# Patient Record
Sex: Male | Born: 1980 | Race: Black or African American | Hispanic: No | Marital: Single | Smoking: Never smoker
Health system: Southern US, Community
[De-identification: ages and names within clinical notes are randomized; demographics above are authoritative.]

## PROBLEM LIST (undated history)

## (undated) DIAGNOSIS — J45909 Unspecified asthma, uncomplicated: Secondary | ICD-10-CM

---

## 2020-06-21 ENCOUNTER — Emergency Department (HOSPITAL_COMMUNITY)
Admission: EM | Admit: 2020-06-21 | Discharge: 2020-06-21 | Disposition: A | Discharge status: Home / Self Care | Payer: Managed Care, Other (non HMO) | Attending: Emergency Medicine | Admitting: Emergency Medicine

## 2020-06-21 ENCOUNTER — Encounter (HOSPITAL_COMMUNITY): Payer: Self-pay | Admitting: Emergency Medicine

## 2020-06-21 DIAGNOSIS — R000 Tachycardia, unspecified: Secondary | ICD-10-CM | POA: Diagnosis not present

## 2020-06-21 DIAGNOSIS — J45909 Unspecified asthma, uncomplicated: Secondary | ICD-10-CM | POA: Diagnosis not present

## 2020-06-21 DIAGNOSIS — R519 Headache, unspecified: Secondary | ICD-10-CM | POA: Diagnosis present

## 2020-06-21 DIAGNOSIS — U071 COVID-19: Secondary | ICD-10-CM | POA: Diagnosis not present

## 2020-06-21 HISTORY — DX: Unspecified asthma, uncomplicated: J45.909

## 2020-06-21 LAB — CBC WITH DIFFERENTIAL/PLATELET
Abs Immature Granulocytes: 0.01 10*3/uL (ref 0.00–0.07)
Basophils Absolute: 0 10*3/uL (ref 0.0–0.1)
Basophils Relative: 0 %
Eosinophils Absolute: 0.1 10*3/uL (ref 0.0–0.5)
Eosinophils Relative: 1 %
HCT: 41.5 % (ref 39.0–52.0)
Hemoglobin: 13.8 g/dL (ref 13.0–17.0)
Immature Granulocytes: 0 %
Lymphocytes Relative: 9 %
Lymphs Abs: 0.6 10*3/uL — ABNORMAL LOW (ref 0.7–4.0)
MCH: 27.5 pg (ref 26.0–34.0)
MCHC: 33.3 g/dL (ref 30.0–36.0)
MCV: 82.7 fL (ref 80.0–100.0)
Monocytes Absolute: 1 10*3/uL (ref 0.1–1.0)
Monocytes Relative: 14 %
Neutro Abs: 5.2 10*3/uL (ref 1.7–7.7)
Neutrophils Relative %: 76 %
Platelets: 275 10*3/uL (ref 150–400)
RBC: 5.02 MIL/uL (ref 4.22–5.81)
RDW: 14.3 % (ref 11.5–15.5)
WBC: 6.9 10*3/uL (ref 4.0–10.5)
nRBC: 0 % (ref 0.0–0.2)

## 2020-06-21 LAB — COMPREHENSIVE METABOLIC PANEL
ALT: 38 U/L (ref 0–44)
AST: 40 U/L (ref 15–41)
Albumin: 4 g/dL (ref 3.5–5.0)
Alkaline Phosphatase: 111 U/L (ref 38–126)
Anion gap: 11 (ref 5–15)
BUN: 10 mg/dL (ref 6–20)
CO2: 22 mmol/L (ref 22–32)
Calcium: 9.1 mg/dL (ref 8.9–10.3)
Chloride: 103 mmol/L (ref 98–111)
Creatinine, Ser: 1.31 mg/dL — ABNORMAL HIGH (ref 0.61–1.24)
GFR, Estimated: >60 mL/min (ref >60)
Glucose, Bld: 152 mg/dL — ABNORMAL HIGH (ref 70–99)
Potassium: 5.2 mmol/L — ABNORMAL HIGH (ref 3.5–5.1)
Sodium: 136 mmol/L (ref 135–145)
Total Bilirubin: 0.6 mg/dL (ref 0.3–1.2)
Total Protein: 6.8 g/dL (ref 6.5–8.1)

## 2020-06-21 LAB — RESP PANEL BY RT-PCR (FLU A&B, COVID) ARPGX2
Influenza A by PCR: NEGATIVE
Influenza B by PCR: NEGATIVE
SARS Coronavirus 2 by RT PCR: POSITIVE — AB

## 2020-06-21 LAB — LACTIC ACID, PLASMA
Lactic Acid, Venous: 2.2 mmol/L (ref 0.5–1.9)
Lactic Acid, Venous: 1.3 mmol/L (ref 0.5–1.9)

## 2020-06-21 MED ORDER — ACETAMINOPHEN 325 MG PO TABS
650.0000 mg | ORAL_TABLET | Freq: Once | ORAL | Status: AC | PRN
  Administered 2020-06-21: 650 mg via ORAL
  Filled 2020-06-21: qty 2

## 2020-06-21 NOTE — ED Provider Notes (Signed)
[facility] EMERGENCY DEPARTMENT Provider Note   CSN: [id number] Arrival date & time: 06/21/20  8250     History Chief Complaint  Patient presents with  . Headache  . Fever    Jose Valencia is a 39 y.o. male with PMH of asthma presents to the ED with a 1 day history of headache and generalized body aches.  He was noted to be febrile to 101.8 F and tachycardic to 112 in triage.  On my examination, patient reports that yesterday he was feeling perfectly fine and then at approximately 7 PM he started feeling "weird".  He turned to his girlfriend stated that something was not right.  He felt hot, with generalized malaise.  He also endorsed a mild headache.  This morning he woke up to go to work and still felt uncomfortable was prompted him to come to the ED for evaluation.  He is not immunized for influenza or COVID-19.  He lives with his girlfriend and children.  He states that his daughter was sick a couple of days ago.  He denies any chest pain, shortness of breath, cough, difficulty breathing, numbness or weakness, nausea, inability to eat or drink, or other symptoms.  HPI     Past Medical History:  Diagnosis Date  . Asthma     There are no problems to display for this patient.   History reviewed. No pertinent surgical history.     No family history on file.     Home Medications Prior to Admission medications   Not on File    Allergies    Patient has no known allergies.  Review of Systems   Review of Systems  All other systems reviewed and are negative.   Physical Exam Updated Vital Signs BP 125/76 (BP Location: Right Arm)   Pulse (!) 103   Temp 99.5 F (37.5 C) (Oral)   Resp 20   Ht 5\' 7"  (1.702 m)   Wt 84.8 kg   SpO2 96%   BMI 29.29 kg/m   Physical Exam Vitals and nursing note reviewed. Exam conducted with a chaperone present.  HENT:     Head: Normocephalic and atraumatic.  Eyes:     General: No scleral icterus.     Conjunctiva/sclera: Conjunctivae normal.  Cardiovascular:     Rate and Rhythm: Regular rhythm. Tachycardia present.     Pulses: Normal pulses.     Heart sounds: Normal heart sounds.  Pulmonary:     Effort: Pulmonary effort is normal. No respiratory distress.     Breath sounds: Normal breath sounds. No wheezing or rales.  Musculoskeletal:     Cervical back: Normal range of motion. No rigidity.     Right lower leg: No edema.     Left lower leg: No edema.  Skin:    General: Skin is dry.     Capillary Refill: Capillary refill takes less than 2 seconds.  Neurological:     General: No focal deficit present.     Mental Status: He is alert and oriented to person, place, and time.     GCS: GCS eye subscore is 4. GCS verbal subscore is 5. GCS motor subscore is 6.  Psychiatric:        Mood and Affect: Mood normal.        Behavior: Behavior normal.        Thought Content: Thought content normal.     ED Results / Procedures / Treatments   Labs (all labs ordered are  listed, but only abnormal results are displayed) Labs Reviewed  RESP PANEL BY RT-PCR (FLU A&B, COVID) ARPGX2 - Abnormal; Notable for the following components:      Result Value   SARS Coronavirus 2 by RT PCR POSITIVE (*)    All other components within normal limits  LACTIC ACID, PLASMA - Abnormal; Notable for the following components:   Lactic Acid, Venous 2.2 (*)    All other components within normal limits  COMPREHENSIVE METABOLIC PANEL - Abnormal; Notable for the following components:   Potassium 5.2 (*)    Glucose, Bld 152 (*)    Creatinine, Ser 1.31 (*)    All other components within normal limits  CBC WITH DIFFERENTIAL/PLATELET - Abnormal; Notable for the following components:   Lymphs Abs 0.6 (*)    All other components within normal limits  LACTIC ACID, PLASMA  URINALYSIS, ROUTINE W REFLEX MICROSCOPIC    EKG None  Radiology No results found.  Procedures Procedures (including critical care  time)  Medications Ordered in ED Medications  acetaminophen (TYLENOL) tablet 650 mg (650 mg Oral Given 06/21/20 0750)    ED Course  I have reviewed the triage vital signs and the nursing notes.  Pertinent labs & imaging results that were available during my care of the patient were reviewed by me and considered in my medical decision making (see chart for details).    MDM Rules/Calculators/A&P                          Patient is resting comfortably on my examination and in no acute distress.  His only symptoms are mild headache and generalized malaise.  His fever was treated appropriately here in the ED with Tylenol and his temperature is now within normal months.  He is mildly tachycardic, likely due to mild dehydration.  I suspect that is also contributing to his mildly elevated lactic acid and mild creatinine elevation.    Respiratory panel by PCR is positive for COVID-19.  He is unvaccinated.  We will reach out to the [facility] infusion center and provide him with a phone number to contact them.  Given his history of asthma and mildly elevated BMI, he is at risk for poor prognosis, particularly given that he is unvaccinated.  Emphasized the importance of [facility] outpatient follow-up.  He has albuterol inhalers at home.  No wheezing or other abnormal breath sounds on my exam.  Patient will use over-the-counter medications for symptomatic relief of his cold symptoms.  Emphasized the importance of increased oral hydration in the context of fevers.  He will check his temperature regularly take Tylenol/ibuprofen as needed for fever control.  He will maintain isolation precautions.  New guidelines suggest 5 days from symptom onset.    ED return precautions discussed.  Patient voices understanding and is agreeable to the plan.  Jose Valencia was evaluated in Emergency Department on 06/21/2020 for the symptoms described in the history of present illness. He was evaluated in the context of the global  COVID-19 pandemic, which necessitated consideration that the patient might be at risk for infection with the SARS-CoV-2 virus that causes COVID-19. Institutional protocols and algorithms that pertain to the evaluation of patients at risk for COVID-19 are in a state of rapid change based on information released by regulatory bodies including the CDC and federal and state organizations. These policies and algorithms were followed during the patient's care in the ED.   Final Clinical Impression(s) / ED Diagnoses  Final diagnoses:  COVID-19    Rx / DC Orders ED Discharge Orders    None       Lorelee New, PA-C 06/21/20 1025    Cathren Laine, MD 06/21/20 1032

## 2020-06-21 NOTE — Discharge Instructions (Signed)
You have tested positive for COVID-19.  Please maintain isolation precautions.  I have contacted the [facility] infusion center and they should give you a phone call at some point.  You may also call them to schedule appointment at [phone].  Please check your temperature regularly and take Tylenol as needed for fever control.  You are likely dehydrated, I recommend increased oral hydration.  You may also use a pulse oximeter that can be obtained over-the-counter to pharmacy to check your oxygen levels if you begin to feel short of breath.  Return to the ED or seek immediate medical attention should you experience any new or worsening symptoms.

## 2020-06-21 NOTE — ED Triage Notes (Signed)
Pt reports headache and body aches that began yesterday. Denies fevers, cough or diarrhea. No recent sick contacts. Febrile in triage.

## 2021-11-29 ENCOUNTER — Emergency Department (HOSPITAL_COMMUNITY)
Admission: EM | Admit: 2021-11-29 | Discharge: 2021-11-30 | Disposition: A | Discharge status: Home / Self Care | Payer: Self-pay | Attending: Emergency Medicine | Admitting: Emergency Medicine

## 2021-11-29 ENCOUNTER — Emergency Department (HOSPITAL_COMMUNITY): Payer: Self-pay

## 2021-11-29 ENCOUNTER — Other Ambulatory Visit: Payer: Self-pay

## 2021-11-29 ENCOUNTER — Encounter (HOSPITAL_COMMUNITY): Payer: Self-pay | Admitting: Emergency Medicine

## 2021-11-29 DIAGNOSIS — R070 Pain in throat: Secondary | ICD-10-CM | POA: Insufficient documentation

## 2021-11-29 DIAGNOSIS — T7840XA Allergy, unspecified, initial encounter: Secondary | ICD-10-CM | POA: Insufficient documentation

## 2021-11-29 LAB — COMPREHENSIVE METABOLIC PANEL
ALT: 34 U/L (ref 0–44)
AST: 23 U/L (ref 15–41)
Albumin: 4.1 g/dL (ref 3.5–5.0)
Alkaline Phosphatase: 96 U/L (ref 38–126)
Anion gap: 10 (ref 5–15)
BUN: 13 mg/dL (ref 6–20)
CO2: 26 mmol/L (ref 22–32)
Calcium: 9.7 mg/dL (ref 8.9–10.3)
Chloride: 102 mmol/L (ref 98–111)
Creatinine, Ser: 1.08 mg/dL (ref 0.61–1.24)
GFR, Estimated: >60 mL/min (ref >60)
Glucose, Bld: 88 mg/dL (ref 70–99)
Potassium: 3.7 mmol/L (ref 3.5–5.1)
Sodium: 138 mmol/L (ref 135–145)
Total Bilirubin: 0.2 mg/dL — ABNORMAL LOW (ref 0.3–1.2)
Total Protein: 7 g/dL (ref 6.5–8.1)

## 2021-11-29 LAB — CBC WITH DIFFERENTIAL/PLATELET
Abs Immature Granulocytes: 0.06 10*3/uL (ref 0.00–0.07)
Basophils Absolute: 0 10*3/uL (ref 0.0–0.1)
Basophils Relative: 0 %
Eosinophils Absolute: 0.1 10*3/uL (ref 0.0–0.5)
Eosinophils Relative: 0 %
HCT: 47.6 % (ref 39.0–52.0)
Hemoglobin: 15.8 g/dL (ref 13.0–17.0)
Immature Granulocytes: 0 %
Lymphocytes Relative: 9 %
Lymphs Abs: 1.6 10*3/uL (ref 0.7–4.0)
MCH: 27.4 pg (ref 26.0–34.0)
MCHC: 33.2 g/dL (ref 30.0–36.0)
MCV: 82.5 fL (ref 80.0–100.0)
Monocytes Absolute: 1 10*3/uL (ref 0.1–1.0)
Monocytes Relative: 6 %
Neutro Abs: 13.9 10*3/uL — ABNORMAL HIGH (ref 1.7–7.7)
Neutrophils Relative %: 85 %
Platelets: 313 10*3/uL (ref 150–400)
RBC: 5.77 MIL/uL (ref 4.22–5.81)
RDW: 13.9 % (ref 11.5–15.5)
WBC: 16.6 10*3/uL — ABNORMAL HIGH (ref 4.0–10.5)
nRBC: 0 % (ref 0.0–0.2)

## 2021-11-29 LAB — LACTIC ACID, PLASMA
Lactic Acid, Venous: 1.3 mmol/L (ref 0.5–1.9)
Lactic Acid, Venous: 1 mmol/L (ref 0.5–1.9)

## 2021-11-29 LAB — TROPONIN I (HIGH SENSITIVITY)
Troponin I (High Sensitivity): 9 ng/L (ref <18)
Troponin I (High Sensitivity): 11 ng/L (ref <18)

## 2021-11-29 MED ORDER — FAMOTIDINE 20 MG PO TABS: 20.0000 mg | ORAL_TABLET | Freq: Once | ORAL | Status: DC

## 2021-11-29 MED ORDER — EPINEPHRINE 0.3 MG/0.3ML IJ SOAJ
0.3000 mg | Freq: Once | INTRAMUSCULAR | Status: AC
  Administered 2021-11-29: 0.3 mg via INTRAMUSCULAR
  Filled 2021-11-29: qty 0.3

## 2021-11-29 MED ORDER — PREDNISONE 20 MG PO TABS: ORAL_TABLET | ORAL | 0 refills | Status: DC

## 2021-11-29 MED ORDER — DIPHENHYDRAMINE HCL 25 MG PO TABS: ORAL_TABLET | ORAL | 0 refills | Status: DC

## 2021-11-29 MED ORDER — FAMOTIDINE IN NACL 20-0.9 MG/50ML-% IV SOLN
20.0000 mg | Freq: Once | INTRAVENOUS | Status: AC
  Administered 2021-11-29: 20 mg via INTRAVENOUS
  Filled 2021-11-29: qty 50

## 2021-11-29 MED ORDER — DIPHENHYDRAMINE HCL 50 MG/ML IJ SOLN
25.0000 mg | Freq: Once | INTRAMUSCULAR | Status: AC
  Administered 2021-11-29: 25 mg via INTRAVENOUS
  Filled 2021-11-29: qty 1

## 2021-11-29 MED ORDER — DIPHENHYDRAMINE HCL 25 MG PO CAPS: 25.0000 mg | ORAL_CAPSULE | Freq: Once | ORAL | Status: DC

## 2021-11-29 MED ORDER — SODIUM CHLORIDE 0.9 % IV SOLN: Freq: Once | INTRAVENOUS | Status: DC

## 2021-11-29 MED ORDER — METHYLPREDNISOLONE SODIUM SUCC 125 MG IJ SOLR
125.0000 mg | Freq: Once | INTRAMUSCULAR | Status: AC
  Administered 2021-11-29: 125 mg via INTRAVENOUS
  Filled 2021-11-29: qty 2

## 2021-11-29 MED ORDER — SODIUM CHLORIDE 0.9 % IV BOLUS
1000.0000 mL | Freq: Once | INTRAVENOUS | Status: AC
  Administered 2021-11-29: 1000 mL via INTRAVENOUS

## 2021-11-29 MED ORDER — IOHEXOL 350 MG/ML SOLN
75.0000 mL | Freq: Once | INTRAVENOUS | Status: AC | PRN
Start: 2021-11-29 — End: 2021-11-29
  Administered 2021-11-29: 75 mL via INTRAVENOUS

## 2021-11-29 MED ORDER — PREDNISONE 20 MG PO TABS: 60.0000 mg | ORAL_TABLET | Freq: Once | ORAL | Status: DC

## 2021-11-29 NOTE — Discharge Instructions (Addendum)
1.  At this time it appears you had an allergic reaction.  It was unusual in the amount of pain you experienced in your neck and throat.  CT scan does not show any abnormalities at this time.  Your treatment will continue to be for allergic reaction.  Take prednisone daily as prescribed.  Take Benadryl 1 to 2 tablets every 6 hours.  Go to the pharmacy and buy over-the-counter Pepcid and take twice daily for the next week. 2.  Get established with a family doctor for recheck.  Use the referral number in your discharge instructions to find one. 3.  Return to emergency department immediately if you find you are having difficulty breathing swallowing feeling lightheaded getting fevers worsening neck pain or any other concerning symptoms.

## 2021-11-29 NOTE — ED Provider Triage Note (Addendum)
Emergency Medicine Provider Triage Evaluation Note  Jose Valencia , a 41 y.o. male  was evaluated in triage.  Pt complains of allergic reaction. Started one hour PTA 2:40 PM.  He feels like his throat is closing up.  No history of allergies, no known exposures.  This happened when his wife was vacuuming, they have a new cat they got 1 week ago.  No hives, no nausea, no vomiting, no chest pain, no shortness of breath.  Review of Systems  Per HPI  Physical Exam  BP 104/79   Pulse (!) 108   Temp 98.1 F (36.7 C) (Oral)   Resp 16   SpO2 95%  Gen:   Awake, no distress   Resp:  Normal effort  MSK:   Moves extremities without difficulty  Other:  No stridor, no hives.  Uvula is midline  Medical Decision Making  Medically screening exam initiated at 3:58 PM.  Appropriate orders placed.  Jose Valencia was informed that the remainder of the evaluation will be completed by another provider, this initial triage assessment does not replace that evaluation, and the importance of remaining in the ED until their evaluation is complete.  Patient stable, will try oral meds and observe.  At this time is not in anaphylaxis.  At this time of reevaluation patient's throat tingling is worsening.  Discussed with Dr. Hyacinth Meeker, plan to give epi and observe.   Theron Arista, PA-C 11/29/21 1559    Theron Arista, PA-C 11/29/21 1651

## 2021-11-29 NOTE — ED Notes (Signed)
PT to CT.

## 2021-11-29 NOTE — ED Triage Notes (Signed)
Pt reports throat swelling at 1440 while his wife was vacuuming. Unknown exposure. Pt reports new cat in the house.

## 2021-11-29 NOTE — ED Provider Notes (Signed)
[facility] EMERGENCY DEPARTMENT Provider Note   CSN: [id number] Arrival date & time: 11/29/21  1545     History  Chief Complaint  Patient presents with   Allergic Reaction    Jose Valencia is a 41 y.o. male.  HPI Patient was taking nap at home.  About 2:40 PM his wife was vacuuming.  He reports he thinks he had an allergic reaction to cat hair.  He reports he got a new cat about a week ago.  He pretty quickly noticed swelling and discomfort in his lips and his throat.  He reports he could feel his throat closing he reports he had visible swelling in the neck and around the lips..  Patient did try 2 Benadryl tablets at home and about that time.  He reports symptoms were still present and he presented to the emergency department.  He was being observed in triage and complained of increasing pain and tightness in the throat.  Patient was given subcu epi in triage and feels like symptoms have started to improve.  He still feels somewhat tight and uncomfortable in the throat.  He did not develop any full body rash..  At baseline patient reports he is healthy.  No regular medications.    Home Medications Prior to Admission medications   Not on File      Allergies    Patient has no known allergies.    Review of Systems   Review of Systems 10 systems reviewed negative except as per HPI Physical Exam Updated Vital Signs BP (!) 128/98   Pulse 99   Temp 98.1 F (36.7 C) (Oral)   Resp (!) 23   SpO2 98%  Physical Exam Constitutional:      Comments: Alert nontoxic well-nourished well-developed no respiratory distress  HENT:     Head: Normocephalic and atraumatic.     Mouth/Throat:     Comments: No tongue swelling.  Patient does have moderate diffuse swelling of the lips.  Voice is slightly hoarse.  No stridor. Eyes:     Extraocular Movements: Extraocular movements intact.  Neck:     Comments: Mild submandibular fullness.  No trismus or meningismus Cardiovascular:      Rate and Rhythm: Normal rate and regular rhythm.  Pulmonary:     Effort: Pulmonary effort is normal.     Breath sounds: Normal breath sounds.  Abdominal:     General: There is no distension.     Palpations: Abdomen is soft.     Tenderness: There is no abdominal tenderness. There is no guarding.  Musculoskeletal:        General: No swelling or tenderness. Normal range of motion.     Right lower leg: No edema.     Left lower leg: No edema.  Skin:    General: Skin is warm and dry.  Neurological:     General: No focal deficit present.     Mental Status: He is oriented to person, place, and time.     Motor: No weakness.     Coordination: Coordination normal.     ED Results / Procedures / Treatments   Labs (all labs ordered are listed, but only abnormal results are displayed) Labs Reviewed - No data to display  EKG None  Radiology No results found.  Procedures Procedures   CRITICAL CARE Performed by: Arby Barrette   Total critical care time: 30 minutes  Critical care time was exclusive of separately billable procedures and treating other patients.  Critical  care was necessary to treat or prevent imminent or life-threatening deterioration.  Critical care was time spent personally by me on the following activities: development of treatment plan with patient and/or surrogate as well as nursing, discussions with consultants, evaluation of patient's response to treatment, examination of patient, obtaining history from patient or surrogate, ordering and performing treatments and interventions, ordering and review of laboratory studies, ordering and review of radiographic studies, pulse oximetry and re-evaluation of patient's condition.  Medications Ordered in ED Medications  methylPREDNISolone sodium succinate (SOLU-MEDROL) 125 mg/2 mL injection 125 mg (has no administration in time range)  diphenhydrAMINE (BENADRYL) injection 25 mg (has no administration in time range)   famotidine (PEPCID) IVPB 20 mg premix (has no administration in time range)  0.9 %  sodium chloride infusion (has no administration in time range)  EPINEPHrine (EPI-PEN) injection 0.3 mg (0.3 mg Intramuscular Given 11/29/21 1656)    ED Course/ Medical Decision Making/ A&P                           Medical Decision Making Amount and/or Complexity of Data Reviewed Labs: ordered. Radiology: ordered.  Risk OTC drugs. Prescription drug management.  Patient had an acute onset of swelling around the mouth and the airway.  He associates this with exposure to cat dander.  It happened while his wife was vacuuming in the house and came on fairly abruptly.  Did improve with subcu epi in triage.  Patient does not have a history of prior allergies.  No significant medical history.  Patient was not ill leading up to this.  This time high suspicion is for allergic reaction.  Patient does not have any precipitants for angioedema however will consider angioedema versus allergic reaction.  At this time we will proceed with Solu-Medrol Pepcid and Benadryl and continue to observe.  18: 13 consult: Dr. Suszanne Conners.  Reviewed the patient's history with persisting and worsening dysphagia with gravelly voice and anterior neck pain.  Recommends addition of CT neck.  CT scan images are examined and reviewed by myself as well as I reviewed radiology interpretation.  Patient's airway is widely patent.  There is slight soft tissue edema by radiology interpretation.  Otherwise normal.  Patient is reassessed after treatment.  He does feel improved.  He still has some discomfort around his throat and neck.  He has had no difficulty breathing.  He did not have any other associated symptoms of rash or difficulty breathing to suggest anaphylaxis.  There is a question of angioedema however no apparent trigger and patient does describe acute onset with exposure to dust and dander.  At this time we will proceed with treatment for allergic  reaction.  Patient will continue prednisone and Benadryl and Pepcid at home.  Careful return precautions reviewed.        Final Clinical Impression(s) / ED Diagnoses Final diagnoses:  Allergic reaction, initial encounter  Throat pain    Rx / DC Orders ED Discharge Orders     None         Arby Barrette, MD 12/02/21 1321

## 2022-01-17 ENCOUNTER — Emergency Department (HOSPITAL_COMMUNITY)
Admission: EM | Admit: 2022-01-17 | Discharge: 2022-01-18 | Disposition: A | Discharge status: Home / Self Care | Payer: Self-pay | Attending: Emergency Medicine | Admitting: Emergency Medicine

## 2022-01-17 ENCOUNTER — Other Ambulatory Visit: Payer: Self-pay

## 2022-01-17 ENCOUNTER — Encounter (HOSPITAL_COMMUNITY): Payer: Self-pay | Admitting: Emergency Medicine

## 2022-01-17 ENCOUNTER — Emergency Department (HOSPITAL_COMMUNITY): Payer: Self-pay

## 2022-01-17 DIAGNOSIS — R531 Weakness: Secondary | ICD-10-CM | POA: Insufficient documentation

## 2022-01-17 DIAGNOSIS — W010XXA Fall on same level from slipping, tripping and stumbling without subsequent striking against object, initial encounter: Secondary | ICD-10-CM | POA: Insufficient documentation

## 2022-01-17 DIAGNOSIS — Y990 Civilian activity done for income or pay: Secondary | ICD-10-CM | POA: Insufficient documentation

## 2022-01-17 DIAGNOSIS — M5441 Lumbago with sciatica, right side: Secondary | ICD-10-CM | POA: Insufficient documentation

## 2022-01-17 DIAGNOSIS — M5410 Radiculopathy, site unspecified: Secondary | ICD-10-CM

## 2022-01-17 MED ORDER — LIDOCAINE 5 % EX PTCH: 1.0000 | MEDICATED_PATCH | Freq: Two times a day (BID) | CUTANEOUS | 0 refills | Status: AC

## 2022-01-17 MED ORDER — METHOCARBAMOL 500 MG PO TABS
1500.0000 mg | ORAL_TABLET | Freq: Once | ORAL | Status: AC
  Administered 2022-01-17: 1500 mg via ORAL
  Filled 2022-01-17: qty 3

## 2022-01-17 MED ORDER — OXYCODONE HCL 5 MG PO TABS
5.0000 mg | ORAL_TABLET | Freq: Once | ORAL | Status: AC
  Administered 2022-01-17: 5 mg via ORAL
  Filled 2022-01-17: qty 1

## 2022-01-17 MED ORDER — METHOCARBAMOL 750 MG PO TABS: 1500.0000 mg | ORAL_TABLET | Freq: Four times a day (QID) | ORAL | 0 refills | Status: AC

## 2022-01-17 MED ORDER — METHYLPREDNISOLONE 4 MG PO TBPK: ORAL_TABLET | ORAL | 0 refills | Status: DC

## 2022-01-17 MED ORDER — DEXAMETHASONE 4 MG PO TABS
4.0000 mg | ORAL_TABLET | Freq: Once | ORAL | Status: AC
  Administered 2022-01-17: 4 mg via ORAL
  Filled 2022-01-17: qty 1

## 2022-01-17 MED ORDER — KETOROLAC TROMETHAMINE 30 MG/ML IJ SOLN
60.0000 mg | Freq: Once | INTRAMUSCULAR | Status: AC
  Administered 2022-01-17: 60 mg via INTRAMUSCULAR
  Filled 2022-01-17 (×2): qty 2

## 2022-01-17 NOTE — Discharge Instructions (Addendum)
You were seen in the emergency department for worsening of your back pain after you slipped on a box.  We got an MRI of your lower back which showed some intervertebral disc bulging affecting the nerves in your lower back which is likely causing your pain.  There was no acute spinal cord injury or impingement seen on the MRI.  Please take the Medrol Dosepak as prescribed, Robaxin 1500 mg 4 times a day, and the pain medication as prescribed.  You can also use the lidocaine patches as needed every 12 hours.  Please follow-up with your primary care physician as well as neurosurgery for further evaluation and management.  If you experience any new or worsening symptoms please return to the emergency department.

## 2022-01-17 NOTE — ED Provider Triage Note (Signed)
Emergency Medicine Provider Triage Evaluation Note  Jose Valencia , a 41 y.o. male  was evaluated in triage.  Pt complains of back pain. States that same was problematic a few years ago and was relieved with a steroid injection. States that he fell on Sunday while at work and reinjured his back. States that he now has to walk with a walker because of pain and weakness in his left leg. Denies any loss of bowel or bladder function or saddle paresthesias.  Denies any numbness or tingling.  Does state that he has shooting pain down his left leg if he moves a certain way.  Denies any fevers or chills.  Review of Systems  Positive:  Negative:   Physical Exam  BP (!) 147/110   Pulse (!) 110   Temp 98.2 F (36.8 C) (Oral)   Resp (!) 22   SpO2 96%  Gen:   Awake, no distress   Resp:  Normal effort  MSK:   Moves extremities without difficulty Other:    Medical Decision Making  Medically screening exam initiated at 4:46 PM.  Appropriate orders placed.  Aswad Wandrey was informed that the remainder of the evaluation will be completed by another provider, this initial triage assessment does not replace that evaluation, and the importance of remaining in the ED until their evaluation is complete.  Work-up initiated   Vear Clock 01/17/22 1651

## 2022-01-17 NOTE — ED Triage Notes (Signed)
Patient here with complaint of right lower back pain radiating into right leg, reports issue with pinched nerve that was treated in [place] but was recently reinjured when he slipped at work recently. Patient denies any urinary or stool incontinence.

## 2022-01-17 NOTE — ED Provider Notes (Signed)
[facility] EMERGENCY DEPARTMENT Provider Note   CSN: [id number] Arrival date & time: 01/17/22  1538     History {Add pertinent medical, surgical, social history, OB history to HPI:1} Chief Complaint  Patient presents with   Back Pain    Jose Valencia is a 41 y.o. male.  Jose Valencia a 41 year old male presenting with 2 days of acute right lower back/hip/leg pain, numbness, and weakness after slipping on a box at work.  He states he was working whenever his foot slipped out from under him and he was able to catch himself with his foot but felt his back immediately started to hurt.  He has had a previous history of right hip and back pain that was relieved in the end by spinal steroid injection.  He states this pain is very similar to that pain.  He has had to use a walker to ambulate since this injury due to increased pain when putting weight on his right leg.  He also states that he gets worse whenever he has to bend over to sit.  Laying flat and still helps alleviate the pain.  He states he has tried muscle relaxers, steroids, opiates, and lidocaine patches in the past that did not fully relieve the pain.   Back Pain Associated symptoms: numbness and weakness   Associated symptoms: no abdominal pain, no chest pain, no dysuria and no fever        Home Medications Prior to Admission medications   Medication Sig Start Date End Date Taking? Authorizing Provider  lidocaine (LIDODERM) 5 % Place 1 patch onto the skin every 12 (twelve) hours for 5 days. Remove & Discard patch within 12 hours or as directed by MD 01/17/22 01/22/22 Yes Rocky Morel, DO  methocarbamol (ROBAXIN-750) 750 MG tablet Take 2 tablets (1,500 mg total) by mouth 4 (four) times daily for 5 days. 01/17/22 01/22/22 Yes Rocky Morel, DO  methylPREDNISolone (MEDROL DOSEPAK) 4 MG TBPK tablet Take as instructed 01/17/22  Yes Rocky Morel, DO  albuterol (VENTOLIN HFA) 108 (90 Base) MCG/ACT inhaler Inhale 2  puffs into the lungs every 6 (six) hours as needed for wheezing or shortness of breath.    [provider]  BAYER BACK & BODY PAIN EX ST 500-32.5 MG TABS Take 2 tablets by mouth every 6 (six) hours as needed (for pain).    [provider]  diphenhydrAMINE (BENADRYL) 25 MG tablet Take 50 mg by mouth as needed (for allergic reactions).    [provider]  diphenhydrAMINE (BENADRYL) 25 MG tablet To 2 tablets every 6 hours for swelling and itching 11/29/21   Arby Barrette, MD  predniSONE (DELTASONE) 20 MG tablet 2 tabs po daily x 4 days 11/29/21   Arby Barrette, MD      Allergies    Shellfish-derived products and Other    Review of Systems   Review of Systems  Constitutional:  Negative for fever.  Respiratory:  Negative for cough and shortness of breath.   Cardiovascular:  Negative for chest pain and leg swelling.  Gastrointestinal:  Negative for abdominal pain, constipation, diarrhea, nausea and vomiting.       No bowel or bladder incontinence  Genitourinary:  Negative for difficulty urinating, dysuria and hematuria.  Musculoskeletal:  Positive for back pain and myalgias. Negative for neck pain and neck stiffness.  Neurological:  Positive for weakness and numbness. Negative for dizziness, syncope, facial asymmetry and light-headedness.  Psychiatric/Behavioral:  Negative for confusion.     Physical  Exam Updated Vital Signs BP 135/87 (BP Location: Right Arm)   Pulse 80   Temp 98.5 F (36.9 C) (Oral)   Resp 17   SpO2 96%  Physical Exam Vitals reviewed.  Constitutional:      Appearance: He is not ill-appearing.     Comments: Middle-age male laying in bed who appears uncomfortable.  HENT:     Mouth/Throat:     Mouth: Mucous membranes are moist.     Pharynx: Oropharynx is clear. No oropharyngeal exudate or posterior oropharyngeal erythema.  Eyes:     General: No scleral icterus.    Extraocular Movements: Extraocular movements intact.      Conjunctiva/sclera: Conjunctivae normal.     Pupils: Pupils are equal, round, and reactive to light.  Musculoskeletal:     Lumbar back: Tenderness (Right paraspinal tenderness, no left paraspinal tenderness) present. No swelling, deformity or bony tenderness.  Neurological:     Mental Status: He is alert and oriented to person, place, and time.     Cranial Nerves: Cranial nerves 2-12 are intact.     Sensory: Sensory deficit (Decree sensation over right leg diffusely involving dermatomes of L2-S1) present.     Motor: Weakness (Decrease strength in right lower extremity which may be due to pain but true weakness cannot be ruled out.) present.     ED Results / Procedures / Treatments   Labs (all labs ordered are listed, but only abnormal results are displayed) Labs Reviewed - No data to display  EKG None  Radiology No results found.  Procedures Procedures  {Document cardiac monitor, telemetry assessment procedure when appropriate:1}  Medications Ordered in ED Medications  ketorolac (TORADOL) 30 MG/ML injection 60 mg (60 mg Intramuscular Given 01/17/22 2011)  methocarbamol (ROBAXIN) tablet 1,500 mg (1,500 mg Oral Given 01/17/22 2010)  dexamethasone (DECADRON) tablet 4 mg (4 mg Oral Given 01/17/22 2011)  oxyCODONE (Oxy IR/ROXICODONE) immediate release tablet 5 mg (5 mg Oral Given 01/17/22 2318)    ED Course/ Medical Decision Making/ A&P                           Medical Decision Making Jose Valencia is a 41 year old male presenting with acute right back/hip/leg pain, weakness, numbness after a near fall that is consistent with a prior back and hip pain that ultimately required spinal steroid injection.  History and physical are concerning for reaggravation of possible nerve impingement but there were no signs or symptoms of cauda equina, conus medullaris, or acute bony abnormalities.  He had an MRI in December 2022 which showed no obvious abnormality that be causing his pain.  Due to his  decreased sensation and possible weakness of the right lower extremity we will obtain a lumbar MRI without contrast. MRI of the lumbar spine without contrast showed bilateral foraminal disc protrusions at the L4-5 levels affecting the left L3 and L4 nerve roots and the right L4 nerve root although the study was limited by moderate to severe motion degradation.  After discussing with the patient we will discharge him with recommended close PCP and neuro follow-up.  We prescribed Robaxin, Medrol Dosepak, pain medication, and lidocaine patches in the interim.  Patient was discharged in stable condition with all questions answered prior to discharge.  Problems Addressed: Acute right-sided low back pain with right-sided sciatica: acute illness or injury Radicular pain: acute illness or injury  Amount and/or Complexity of Data Reviewed External Data Reviewed: notes. Radiology: ordered and independent interpretation  performed. Decision-making details documented in ED Course.  Risk Prescription drug management.   ***  {Document critical care time when appropriate:1} {Document review of labs and clinical decision tools ie heart score, Chads2Vasc2 etc:1}  {Document your independent review of radiology images, and any outside records:1} {Document your discussion with family members, caretakers, and with consultants:1} {Document social determinants of health affecting pt's care:1} {Document your decision making why or why not admission, treatments were needed:1} Final Clinical Impression(s) / ED Diagnoses Final diagnoses:  Radicular pain  Acute right-sided low back pain with right-sided sciatica    Rx / DC Orders ED Discharge Orders          Ordered    methocarbamol (ROBAXIN-750) 750 MG tablet  4 times daily        01/17/22 2331    methylPREDNISolone (MEDROL DOSEPAK) 4 MG TBPK tablet       Note to Pharmacy: Please dispense dose pack with instructions.   01/17/22 2331    lidocaine (LIDODERM)  5 %  Every 12 hours        01/17/22 2331

## 2022-01-18 NOTE — ED Notes (Signed)
Patient verbalizes understanding of discharge instructions. Opportunity for questioning and answers were provided. Armband removed by staff, pt discharged from ED. Pt taken to ED entrance via wheel chair.  

## 2022-01-24 ENCOUNTER — Encounter (HOSPITAL_COMMUNITY): Payer: Self-pay

## 2022-01-24 ENCOUNTER — Emergency Department (HOSPITAL_COMMUNITY)
Admission: EM | Admit: 2022-01-24 | Discharge: 2022-01-24 | Disposition: A | Discharge status: Home / Self Care | Payer: Self-pay | Attending: Emergency Medicine | Admitting: Emergency Medicine

## 2022-01-24 DIAGNOSIS — M5441 Lumbago with sciatica, right side: Secondary | ICD-10-CM | POA: Insufficient documentation

## 2022-01-24 DIAGNOSIS — M5431 Sciatica, right side: Secondary | ICD-10-CM

## 2022-01-24 MED ORDER — IBUPROFEN 600 MG PO TABS: 600.0000 mg | ORAL_TABLET | Freq: Four times a day (QID) | ORAL | 0 refills | Status: AC | PRN

## 2022-01-24 MED ORDER — PREDNISONE 20 MG PO TABS
60.0000 mg | ORAL_TABLET | Freq: Once | ORAL | Status: AC
Start: 2022-01-24 — End: 2022-01-24
  Administered 2022-01-24: 60 mg via ORAL
  Filled 2022-01-24: qty 3

## 2022-01-24 MED ORDER — PREDNISONE 20 MG PO TABS: ORAL_TABLET | ORAL | 0 refills | Status: DC

## 2022-01-24 MED ORDER — OXYCODONE-ACETAMINOPHEN 5-325 MG PO TABS
1.0000 | ORAL_TABLET | Freq: Once | ORAL | Status: AC
  Administered 2022-01-24: 1 via ORAL
  Filled 2022-01-24: qty 1

## 2022-01-24 MED ORDER — CYCLOBENZAPRINE HCL 10 MG PO TABS: 10.0000 mg | ORAL_TABLET | Freq: Two times a day (BID) | ORAL | 0 refills | Status: DC | PRN

## 2022-01-24 MED ORDER — HYDROCODONE-ACETAMINOPHEN 5-325 MG PO TABS
1.0000 | ORAL_TABLET | Freq: Once | ORAL | Status: AC
  Administered 2022-01-24: 1 via ORAL
  Filled 2022-01-24: qty 1

## 2022-01-24 NOTE — ED Triage Notes (Addendum)
Pt arrives via EMS from home for worsening sciatica pain, states pain has worsened recently. He also reports he has known compression fractures. Has not lost control of bowel or bladder

## 2022-01-24 NOTE — Discharge Instructions (Addendum)
It is important for you to follow-up with neurosurgery for further managements of your recurrent radicular leg pain related to sciatica.  Take medication prescribed.

## 2022-01-24 NOTE — ED Provider Notes (Signed)
[facility]-EMERGENCY DEPT Provider Note   CSN: [id number] Arrival date & time: 01/24/22  1639     History  Chief Complaint  Patient presents with   Sciatica    Jose Valencia is a 41 y.o. male.  The history is provided by the patient and medical records. No language interpreter was used.    42 year old male brought here via EMS for evaluation of radicular back pain.  Patient report he has had recurrent radicular back pain down his right leg ongoing since October of last year.  It has been waxing waning but becoming more more problematic and affecting his daily activity.  He was last seen on the 26th of last month for his complaint and had an MRI done at that time which shows slipped disc.  He did receive lidocaine patch, muscle relaxant, and steroid medication which she took but report no improvement of his symptoms.  He tries follow-up with neurosurgeon but have not been able to be seen by them yet.  He is here with worsening pain.  He does not endorse fever.  He does endorse some urinary discomfort due to his back pain but denies bowel bladder incontinence or saddle anesthesia.  No history of IV drug use or active cancer.  No abdominal pain no dysuria.  Home Medications Prior to Admission medications   Medication Sig Start Date End Date Taking? Authorizing Provider  albuterol (VENTOLIN HFA) 108 (90 Base) MCG/ACT inhaler Inhale 2 puffs into the lungs every 6 (six) hours as needed for wheezing or shortness of breath.    [provider]  BAYER BACK & BODY PAIN EX ST 500-32.5 MG TABS Take 2 tablets by mouth every 6 (six) hours as needed (for pain).    [provider]  diphenhydrAMINE (BENADRYL) 25 MG tablet Take 50 mg by mouth as needed (for allergic reactions).    [provider]  diphenhydrAMINE (BENADRYL) 25 MG tablet To 2 tablets every 6 hours for swelling and itching 11/29/21   Arby Barrette, MD  methylPREDNISolone (MEDROL DOSEPAK) 4 MG  TBPK tablet Take as instructed 01/17/22   Rocky Morel, DO  predniSONE (DELTASONE) 20 MG tablet 2 tabs po daily x 4 days 11/29/21   Arby Barrette, MD      Allergies    Shellfish-derived products and Other    Review of Systems   Review of Systems  All other systems reviewed and are negative.   Physical Exam Updated Vital Signs BP (!) 130/92   Pulse (!) 103   Temp 98.3 F (36.8 C)   Resp 17   Ht 5\' 7"  (1.702 m)   Wt 86.2 kg   SpO2 99%   BMI 29.76 kg/m  Physical Exam Vitals and nursing note reviewed.  Constitutional:      General: He is not in acute distress.    Appearance: He is well-developed.  HENT:     Head: Atraumatic.  Eyes:     Conjunctiva/sclera: Conjunctivae normal.  Abdominal:     Palpations: Abdomen is soft.     Tenderness: There is no abdominal tenderness.  Musculoskeletal:        General: Tenderness (Tenderness along lumbar and right lumbosacral region on palpation with positive right straight leg raise.  No foot drop.  Pedal pulse palpable bilaterally.) present.     Cervical back: Neck supple.  Skin:    Findings: No rash.  Neurological:     Mental Status: He is alert.     ED Results /  Procedures / Treatments   Labs (all labs ordered are listed, but only abnormal results are displayed) Labs Reviewed - No data to display  EKG None  Radiology No results found.  Procedures Procedures    Medications Ordered in ED Medications  HYDROcodone-acetaminophen (NORCO/VICODIN) 5-325 MG per tablet 1 tablet (1 tablet Oral Given 01/24/22 1659)    ED Course/ Medical Decision Making/ A&P                           Medical Decision Making  BP (!) 130/92   Pulse (!) 103   Temp 98.3 F (36.8 C)   Resp 17   Ht 5\' 7"  (1.702 m)   Wt 86.2 kg   SpO2 99%   BMI 29.76 kg/m   6:05 PM This is a 41 year old male presenting with recurrent and radicular right leg pain from his lower back ongoing since October of last year.  Symptom has been recurrent and  becoming progressively worse.  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  He did receive treatment at that time but states it has not provided adequate relief.  He is using a back brace and walker to help moving about.  He does endorse some urinary discomfort and states he is having trouble standing up to pee but able to urinate.  He does not endorse bowel bladder incontinence or saddle anesthesia.  He does not have any red flags.  He denies any recent injury.  He works at a [employer].  On exam, patient is laying in bed able to move all 4 extremities.  He does have tenderness to his lumbar spine with positive straight leg raise on his right leg.  He is neurovascular intact.  I have low suspicion for cauda equina causing his symptoms.  And I do not believe patient would need another repeat MRI at this time.  I do think patient will benefit from outpatient management with neurosurgeon as well as PT and OT.  I provide patient with opiate pain medication for symptom control.  6:31 PM Patient received opiate pain medication with improvement of symptoms.  He is able to use his walker to help with ambulation.  He does have a back brace that he is currently wearing.  I have prescribed patient prednisone, muscle relaxant, and anti-inflammatory medication for symptom control.  Recommend outpatient follow-up with neurosurgeon for further care.  Return precaution given        Final Clinical Impression(s) / ED Diagnoses Final diagnoses:  Right sided sciatica    Rx / DC Orders ED Discharge Orders          Ordered    predniSONE (DELTASONE) 20 MG tablet        01/24/22 1829    cyclobenzaprine (FLEXERIL) 10 MG tablet  2 times daily PRN        01/24/22 1829    ibuprofen (ADVIL) 600 MG tablet  Every 6 hours PRN        01/24/22 1829              03/26/22, PA-C 01/24/22 1832     03/26/22, MD 01/25/22 2326

## 2023-03-19 ENCOUNTER — Encounter (HOSPITAL_COMMUNITY): Payer: Self-pay

## 2023-03-19 ENCOUNTER — Emergency Department (HOSPITAL_COMMUNITY)
Admission: EM | Admit: 2023-03-19 | Discharge: 2023-03-20 | Discharge status: Against Medical Advice | Payer: Self-pay | Attending: Emergency Medicine | Admitting: Emergency Medicine

## 2023-03-19 ENCOUNTER — Emergency Department (HOSPITAL_COMMUNITY): Payer: Self-pay

## 2023-03-19 DIAGNOSIS — Z5321 Procedure and treatment not carried out due to patient leaving prior to being seen by health care provider: Secondary | ICD-10-CM | POA: Insufficient documentation

## 2023-03-19 DIAGNOSIS — J45909 Unspecified asthma, uncomplicated: Secondary | ICD-10-CM | POA: Insufficient documentation

## 2023-03-19 DIAGNOSIS — R0602 Shortness of breath: Secondary | ICD-10-CM | POA: Insufficient documentation

## 2023-03-19 LAB — CBC WITH DIFFERENTIAL/PLATELET
Abs Immature Granulocytes: 0.04 10*3/uL (ref 0.00–0.07)
Basophils Absolute: 0 10*3/uL (ref 0.0–0.1)
Basophils Relative: 0 %
Eosinophils Absolute: 0.2 10*3/uL (ref 0.0–0.5)
Eosinophils Relative: 1 %
HCT: 41.4 % (ref 39.0–52.0)
Hemoglobin: 13.4 g/dL (ref 13.0–17.0)
Immature Granulocytes: 0 %
Lymphocytes Relative: 20 %
Lymphs Abs: 2.5 10*3/uL (ref 0.7–4.0)
MCH: 27.3 pg (ref 26.0–34.0)
MCHC: 32.4 g/dL (ref 30.0–36.0)
MCV: 84.3 fL (ref 80.0–100.0)
Monocytes Absolute: 1 10*3/uL (ref 0.1–1.0)
Monocytes Relative: 8 %
Neutro Abs: 9.2 10*3/uL — ABNORMAL HIGH (ref 1.7–7.7)
Neutrophils Relative %: 71 %
Platelets: 315 10*3/uL (ref 150–400)
RBC: 4.91 MIL/uL (ref 4.22–5.81)
RDW: 14.2 % (ref 11.5–15.5)
WBC: 13 10*3/uL — ABNORMAL HIGH (ref 4.0–10.5)
nRBC: 0 % (ref 0.0–0.2)

## 2023-03-19 LAB — BASIC METABOLIC PANEL
Anion gap: 15 (ref 5–15)
BUN: 11 mg/dL (ref 6–20)
CO2: 26 mmol/L (ref 22–32)
Calcium: 9.6 mg/dL (ref 8.9–10.3)
Chloride: 101 mmol/L (ref 98–111)
Creatinine, Ser: 1.03 mg/dL (ref 0.61–1.24)
GFR, Estimated: >60 mL/min (ref >60)
Glucose, Bld: 104 mg/dL — ABNORMAL HIGH (ref 70–99)
Potassium: 3.6 mmol/L (ref 3.5–5.1)
Sodium: 142 mmol/L (ref 135–145)

## 2023-03-19 MED ORDER — ALBUTEROL SULFATE HFA 108 (90 BASE) MCG/ACT IN AERS
2.0000 | INHALATION_SPRAY | RESPIRATORY_TRACT | Status: DC | PRN
Start: 2023-03-19 — End: 2023-03-20

## 2023-03-19 NOTE — ED Triage Notes (Signed)
Pt is coming in with a Hx of asthma, he states at 5pm he felt like his asthma was flaring up, he went home to do a nebulizer but he could still feel his airway tightneing slightly. He has some chest pain at this moment, no reported fevers, and is able to have a full conversation without extreme dyspnea.

## 2023-03-20 NOTE — ED Notes (Signed)
Called again for pt. No answer.

## 2023-03-20 NOTE — ED Notes (Signed)
Pt called for room. No answer. Will call again in 3-5 minutes.

## 2023-06-29 IMAGING — CT CT ANGIO NECK
2 of 6 series · 16 of 46 positions shown, 18 images · IV contrast (APPLIED)
Comparison: None Available.

CLINICAL DATA: Laryngeal edema with hoarse voice



[Series 5: cta neck 2.0 i30f 3 · axial · 0.48mm/px · z∈[-962,-698]mm · 13 of 146 slices shown, 15 images]
[im 7/146  soft-tissue]
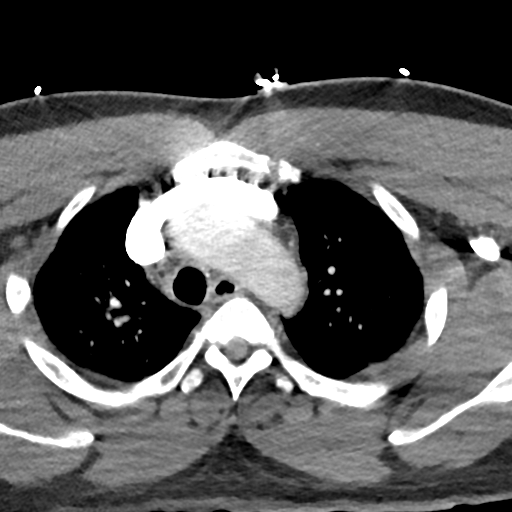
[im 7/146  bone]
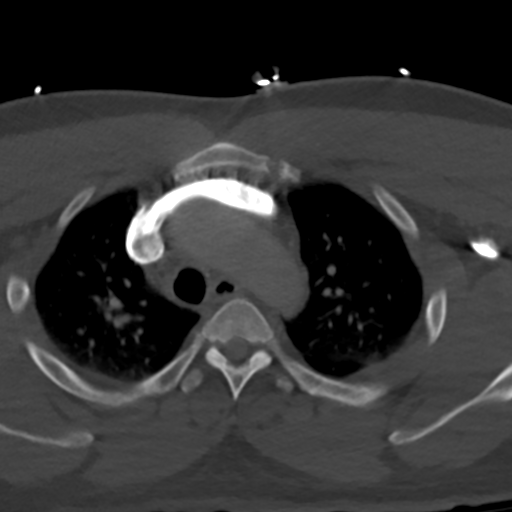
[im 20/146  soft-tissue]
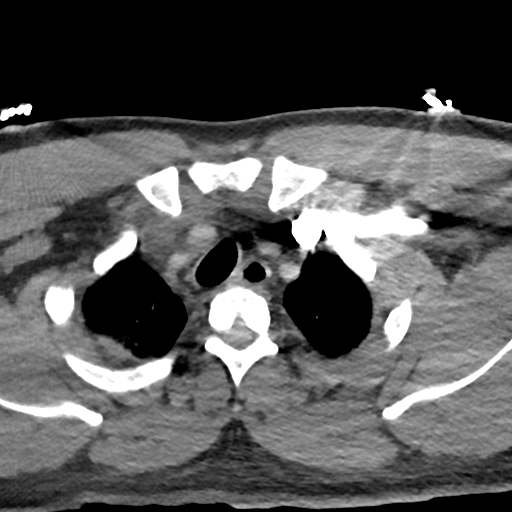
[im 33/146  soft-tissue]
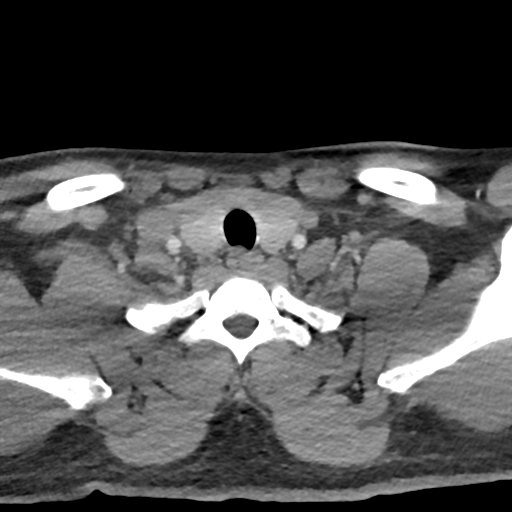
[im 40/146  soft-tissue]
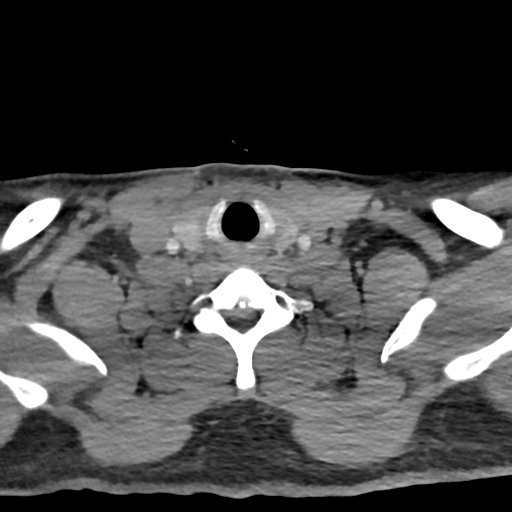
[im 53/146  soft-tissue]
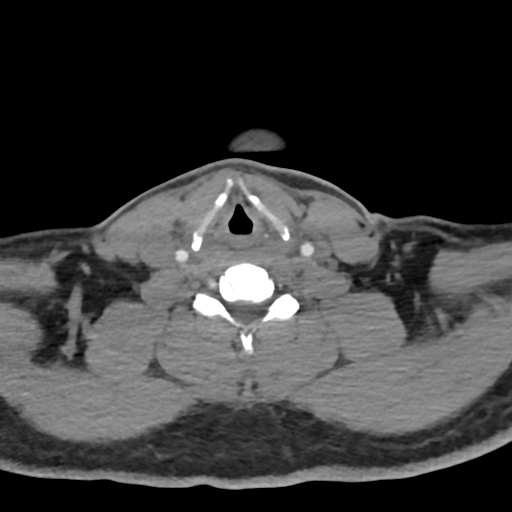
[im 60/146  soft-tissue]
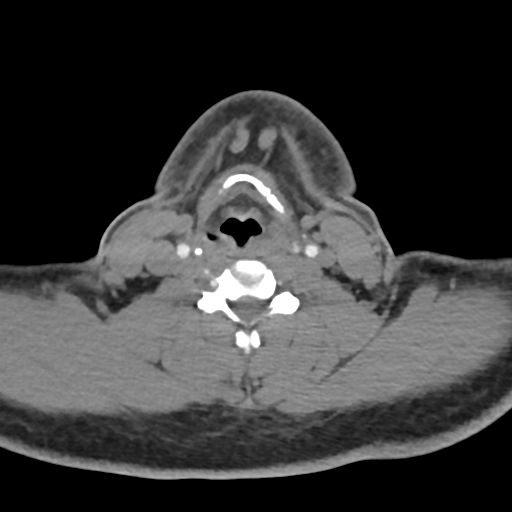
[im 73/146  soft-tissue]
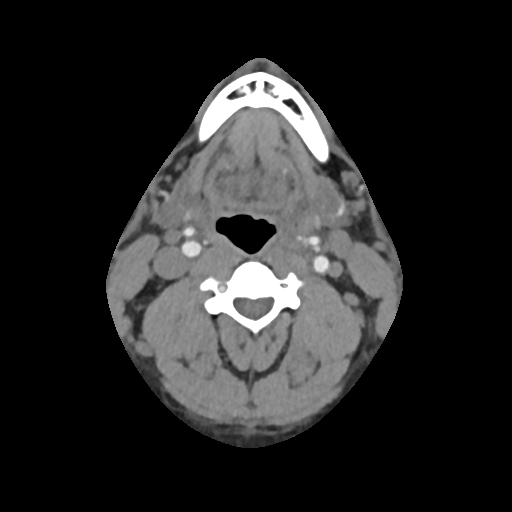
[im 86/146  soft-tissue]
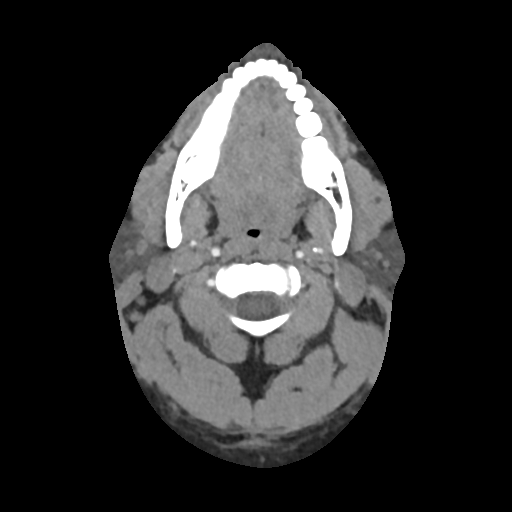
[im 93/146  soft-tissue]
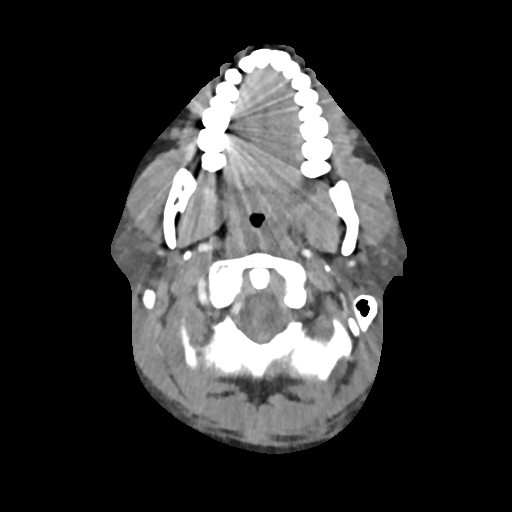
[im 93/146  bone]
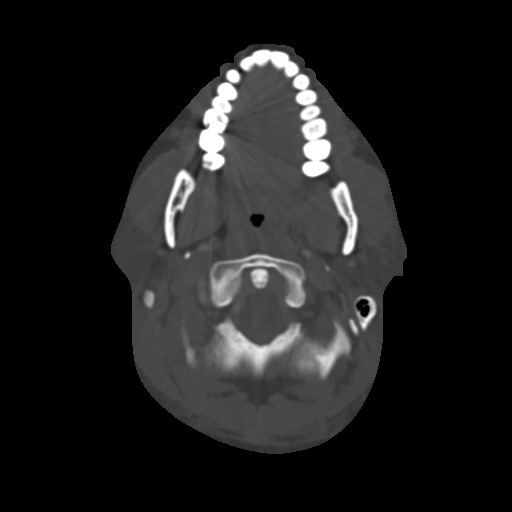
[im 106/146  soft-tissue]
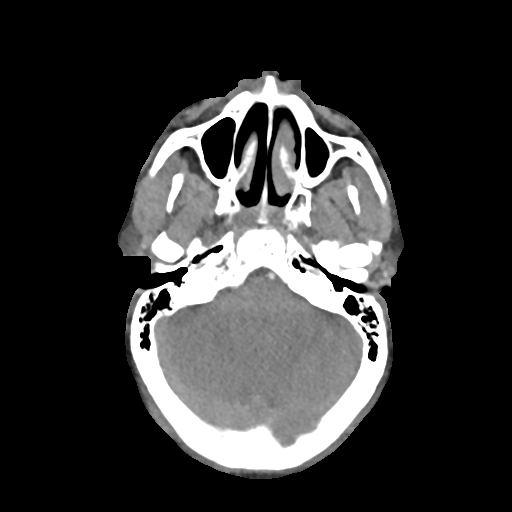
[im 113/146  soft-tissue]
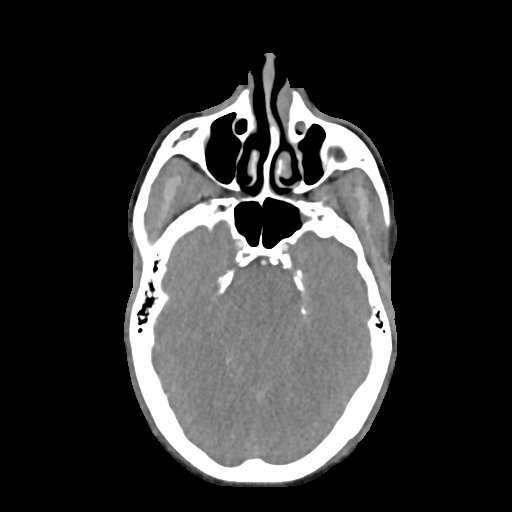
[im 126/146  soft-tissue]
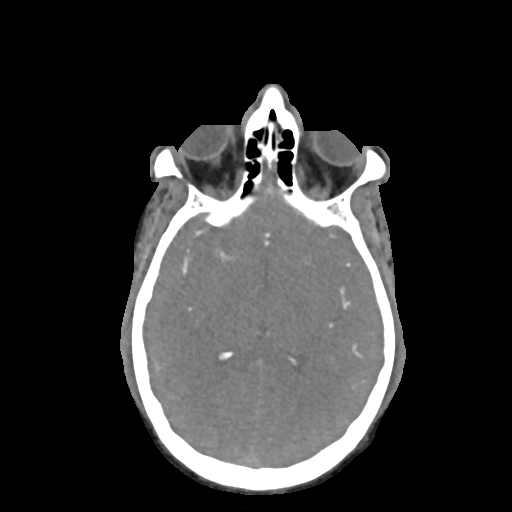
[im 139/146  soft-tissue]
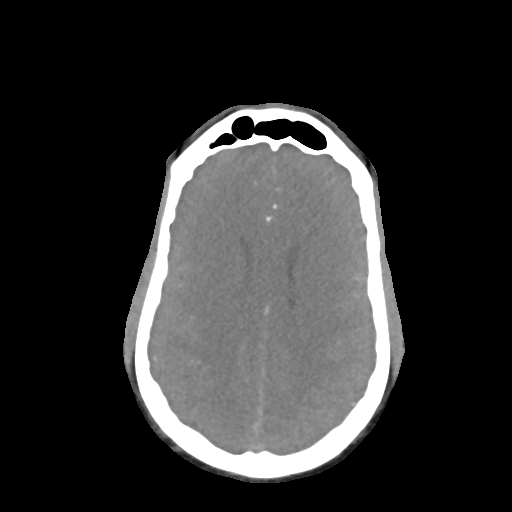

[Series 7: coronal thin · coronal · 0.40mm/px · 3 of 243 slices shown]
[im 70/243  soft-tissue]
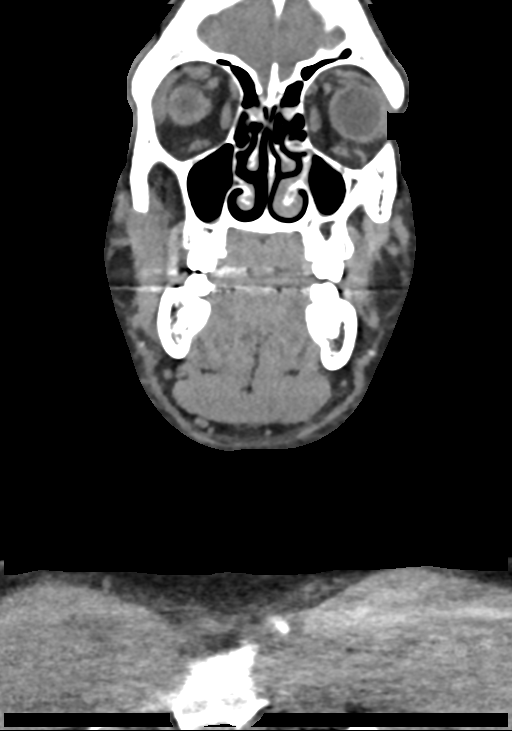
[im 104/243  soft-tissue]
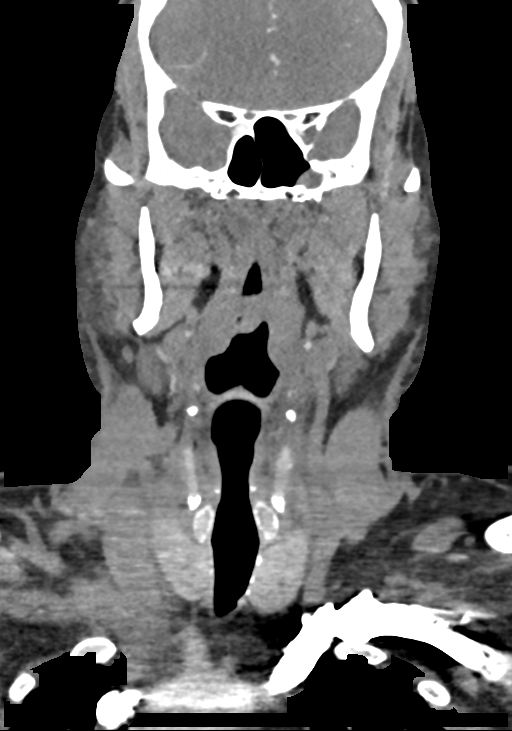
[im 139/243  soft-tissue]
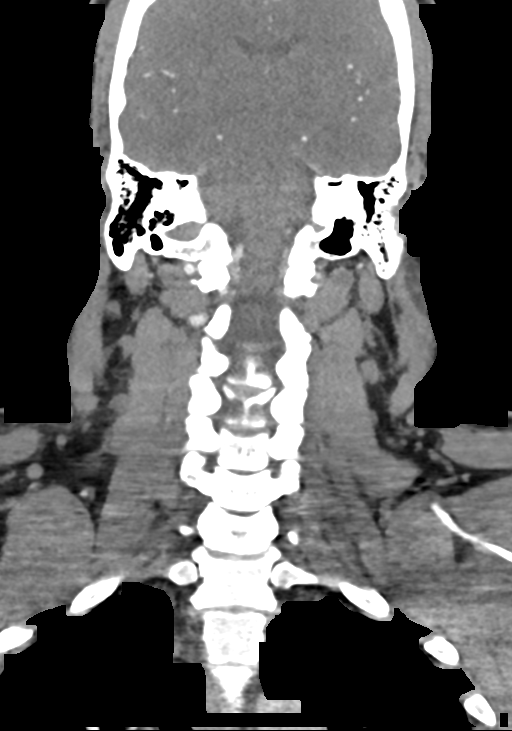

[16 of 46 positions shown; findings below may reference images not displayed]

RADIATION DOSE REDUCTION: This exam was performed according to the
departmental dose-optimization program which includes automated
exposure control, adjustment of the mA and/or kV according to
patient size and/or use of iterative reconstruction technique.

CONTRAST:  75mL OMNIPAQUE IOHEXOL 350 MG/ML SOLN
FINDINGS: Aortic arch: Standard branching. Imaged portion shows no evidence of
aneurysm or dissection. No significant stenosis of the major arch
vessel origins.

Right carotid system: No evidence of dissection, stenosis (50% or
greater) or occlusion.

Left carotid system: No evidence of dissection, stenosis (50% or
greater) or occlusion. Mild atherosclerotic calcification.

Vertebral arteries: Right dominant. No evidence of dissection,
stenosis (50% or greater) or occlusion.

Skeleton: Negative

Other neck: There is mild bilateral facial subcutaneous edema. There
is no airway stenosis.

Upper chest: Unremarkable
IMPRESSION: 1. No dissection, occlusion or stenosis of the carotid or vertebral
arteries.
2. Mild bilateral facial subcutaneous edema.
3. No airway stenosis.

## 2023-07-31 ENCOUNTER — Encounter (HOSPITAL_COMMUNITY): Payer: Self-pay | Admitting: *Deleted

## 2023-07-31 ENCOUNTER — Emergency Department (HOSPITAL_COMMUNITY): Payer: Self-pay

## 2023-07-31 ENCOUNTER — Emergency Department (HOSPITAL_COMMUNITY)
Admission: EM | Admit: 2023-07-31 | Discharge: 2023-07-31 | Disposition: A | Discharge status: Home / Self Care | Payer: Self-pay | Attending: Emergency Medicine | Admitting: Emergency Medicine

## 2023-07-31 ENCOUNTER — Emergency Department (HOSPITAL_COMMUNITY): Payer: Medicaid Other

## 2023-07-31 DIAGNOSIS — M545 Low back pain, unspecified: Secondary | ICD-10-CM | POA: Diagnosis present

## 2023-07-31 DIAGNOSIS — M79606 Pain in leg, unspecified: Secondary | ICD-10-CM | POA: Diagnosis not present

## 2023-07-31 DIAGNOSIS — R000 Tachycardia, unspecified: Secondary | ICD-10-CM | POA: Diagnosis not present

## 2023-07-31 DIAGNOSIS — M5431 Sciatica, right side: Secondary | ICD-10-CM

## 2023-07-31 LAB — CBC WITH DIFFERENTIAL/PLATELET
Abs Immature Granulocytes: 0.04 10*3/uL (ref 0.00–0.07)
Basophils Absolute: 0 10*3/uL (ref 0.0–0.1)
Basophils Relative: 0 %
Eosinophils Absolute: 0.1 10*3/uL (ref 0.0–0.5)
Eosinophils Relative: 1 %
HCT: 44.2 % (ref 39.0–52.0)
Hemoglobin: 14.4 g/dL (ref 13.0–17.0)
Immature Granulocytes: 0 %
Lymphocytes Relative: 18 %
Lymphs Abs: 2.4 10*3/uL (ref 0.7–4.0)
MCH: 27.7 pg (ref 26.0–34.0)
MCHC: 32.6 g/dL (ref 30.0–36.0)
MCV: 85 fL (ref 80.0–100.0)
Monocytes Absolute: 1.1 10*3/uL — ABNORMAL HIGH (ref 0.1–1.0)
Monocytes Relative: 8 %
Neutro Abs: 9.3 10*3/uL — ABNORMAL HIGH (ref 1.7–7.7)
Neutrophils Relative %: 73 %
Platelets: 327 10*3/uL (ref 150–400)
RBC: 5.2 MIL/uL (ref 4.22–5.81)
RDW: 14.6 % (ref 11.5–15.5)
WBC: 12.8 10*3/uL — ABNORMAL HIGH (ref 4.0–10.5)
nRBC: 0 % (ref 0.0–0.2)

## 2023-07-31 LAB — BASIC METABOLIC PANEL
Anion gap: 9 (ref 5–15)
BUN: 17 mg/dL (ref 6–20)
CO2: 25 mmol/L (ref 22–32)
Calcium: 9.2 mg/dL (ref 8.9–10.3)
Chloride: 101 mmol/L (ref 98–111)
Creatinine, Ser: 0.96 mg/dL (ref 0.61–1.24)
GFR, Estimated: >60 mL/min (ref >60)
Glucose, Bld: 117 mg/dL — ABNORMAL HIGH (ref 70–99)
Potassium: 3.9 mmol/L (ref 3.5–5.1)
Sodium: 135 mmol/L (ref 135–145)

## 2023-07-31 MED ORDER — OXYCODONE-ACETAMINOPHEN 5-325 MG PO TABS
1.0000 | ORAL_TABLET | Freq: Once | ORAL | Status: AC
Start: 2023-07-31 — End: 2023-07-31
  Administered 2023-07-31: 1 via ORAL
  Filled 2023-07-31: qty 1

## 2023-07-31 MED ORDER — DIAZEPAM 5 MG PO TABS
5.0000 mg | ORAL_TABLET | Freq: Once | ORAL | Status: AC
  Administered 2023-07-31: 5 mg via ORAL
  Filled 2023-07-31: qty 1

## 2023-07-31 MED ORDER — ONDANSETRON 4 MG PO TBDP
4.0000 mg | ORAL_TABLET | Freq: Once | ORAL | Status: AC
  Administered 2023-07-31: 4 mg via ORAL
  Filled 2023-07-31: qty 1

## 2023-07-31 MED ORDER — HYDROMORPHONE HCL 1 MG/ML IJ SOLN
1.0000 mg | Freq: Once | INTRAMUSCULAR | Status: AC
  Administered 2023-07-31: 1 mg via INTRAVENOUS
  Filled 2023-07-31: qty 1

## 2023-07-31 MED ORDER — KETOROLAC TROMETHAMINE 30 MG/ML IJ SOLN
30.0000 mg | Freq: Once | INTRAMUSCULAR | Status: AC
  Administered 2023-07-31: 30 mg via INTRAMUSCULAR
  Filled 2023-07-31: qty 1

## 2023-07-31 MED ORDER — IBUPROFEN 200 MG PO TABS
600.0000 mg | ORAL_TABLET | Freq: Once | ORAL | Status: AC
  Administered 2023-07-31: 600 mg via ORAL
  Filled 2023-07-31: qty 3

## 2023-07-31 MED ORDER — PREDNISONE 20 MG PO TABS
60.0000 mg | ORAL_TABLET | Freq: Once | ORAL | Status: AC
  Administered 2023-07-31: 60 mg via ORAL
  Filled 2023-07-31: qty 3

## 2023-07-31 MED ORDER — HYDROMORPHONE HCL 1 MG/ML IJ SOLN
1.0000 mg | Freq: Once | INTRAMUSCULAR | Status: AC
  Administered 2023-07-31: 1 mg via INTRAMUSCULAR
  Filled 2023-07-31: qty 1

## 2023-07-31 MED ORDER — DEXAMETHASONE SODIUM PHOSPHATE 10 MG/ML IJ SOLN
10.0000 mg | Freq: Once | INTRAMUSCULAR | Status: AC
  Administered 2023-07-31: 10 mg via INTRAVENOUS
  Filled 2023-07-31: qty 1

## 2023-07-31 MED ORDER — METHYLPREDNISOLONE 4 MG PO TBPK: ORAL_TABLET | ORAL | 0 refills | Status: DC

## 2023-07-31 MED ORDER — ONDANSETRON HCL 4 MG/2ML IJ SOLN
4.0000 mg | Freq: Once | INTRAMUSCULAR | Status: AC
  Administered 2023-07-31: 4 mg via INTRAVENOUS
  Filled 2023-07-31: qty 2

## 2023-07-31 MED ORDER — KETAMINE HCL 50 MG/5ML IJ SOSY
0.3000 mg/kg | PREFILLED_SYRINGE | Freq: Once | INTRAMUSCULAR | Status: AC
  Administered 2023-07-31: 25 mg via INTRAVENOUS
  Filled 2023-07-31: qty 5

## 2023-07-31 MED ORDER — OXYCODONE-ACETAMINOPHEN 5-325 MG PO TABS: 1.0000 | ORAL_TABLET | ORAL | 0 refills | Status: AC | PRN

## 2023-07-31 MED ORDER — LACTATED RINGERS IV BOLUS
1000.0000 mL | Freq: Once | INTRAVENOUS | Status: AC
  Administered 2023-07-31: 1000 mL via INTRAVENOUS

## 2023-07-31 MED ORDER — METHYLPREDNISOLONE 4 MG PO TBPK: ORAL_TABLET | ORAL | 0 refills | Status: AC

## 2023-07-31 NOTE — ED Notes (Signed)
 Patient transported to MRI

## 2023-07-31 NOTE — ED Triage Notes (Signed)
 Pt reports hx of back pain, 3 days ago reports triggered sciatica and pain is radiating down right leg.

## 2023-07-31 NOTE — Discharge Instructions (Addendum)
 Follow-up with neurosurgery as an outpatient.  We are prescribing you Percocet for pain control, you can take this if the ibuprofen  and Tylenol  is not controlling your pain.  Have caution as this does have some Tylenol  in it and can cause sedation, dizziness, or other possible side effects.  Do not drive or operate heavy machinery while taking this medicine.  If you develop worsening, recurrent, or continued back pain, numbness or weakness in the legs, incontinence of your bowels or bladders, numbness of your buttocks, fever, abdominal pain, or any other new/concerning symptoms then return to the ER for evaluation.

## 2023-07-31 NOTE — ED Notes (Signed)
 Pt ambulated to room from lobby. Denies improvement of pain with meds

## 2023-07-31 NOTE — ED Provider Notes (Signed)
 Care transferred to me. MRI shows progression of previous disease, but no emergent findings. He is moving his right leg better, feels improved pain control and well enough for discharge. Dr. Carita has already sent in medrol  dosepak, will add on percocet for pain control and have him follow up with neurosurgery as an outpatient.    Jose Hamilton, MD 07/31/23 1520

## 2023-07-31 NOTE — ED Provider Notes (Signed)
  EMERGENCY DEPARTMENT AT [facility] Provider Note   CSN: [id number] Arrival date & time: 07/31/23  9964     History  Chief Complaint  Patient presents with   Leg Pain    Jose Valencia is a 43 y.o. male.  Patient with right-sided low back pain for the past 3 days.  States similar to previous episodes of sciatica.  Denies any fall or injury.  Pain starts at his right buttock and radiates down his right leg associate with numbness and tingling.  No weakness.  No fever.  No vomiting.  No bowel or bladder incontinence.  He is taken ibuprofen  without relief.  No previous back surgeries.  States he has multiple bulging disc in his lumbar spine.  He has received epidural injections in the past.  No bowel or bladder incontinence.  No fever.  No vomiting.  No chest pain or shortness of breath.  No abdominal pain.  Difficulty walking secondary to the pain.  The history is provided by the patient.  Leg Pain Associated symptoms: back pain   Associated symptoms: no fever        Home Medications Prior to Admission medications   Medication Sig Start Date End Date Taking? Authorizing Provider  albuterol  (VENTOLIN  HFA) 108 (90 Base) MCG/ACT inhaler Inhale 2 puffs into the lungs every 6 (six) hours as needed for wheezing or shortness of breath.    [provider]  BAYER BACK & BODY PAIN EX ST 500-32.5 MG TABS Take 2 tablets by mouth every 6 (six) hours as needed (for pain).    [provider]  cyclobenzaprine  (FLEXERIL ) 10 MG tablet Take 1 tablet (10 mg total) by mouth 2 (two) times daily as needed for muscle spasms. 01/24/22   Nivia Colon, PA-C  diphenhydrAMINE  (BENADRYL ) 25 MG tablet Take 50 mg by mouth as needed (for allergic reactions).    [provider]  diphenhydrAMINE  (BENADRYL ) 25 MG tablet To 2 tablets every 6 hours for swelling and itching 11/29/21   Armenta Canning, MD  ibuprofen  (ADVIL ) 600 MG tablet Take 1 tablet (600 mg total) by mouth every 6  (six) hours as needed. 01/24/22   Nivia Colon, PA-C  predniSONE  (DELTASONE ) 20 MG tablet 3 tabs po day one, then 2 tabs daily x 4 days 01/24/22   Nivia Colon, PA-C      Allergies    Shellfish-derived products and Other    Review of Systems   Review of Systems  Constitutional:  Negative for activity change, appetite change and fever.  HENT:  Negative for congestion and rhinorrhea.   Respiratory:  Negative for cough, chest tightness and shortness of breath.   Cardiovascular:  Negative for chest pain.  Gastrointestinal:  Negative for abdominal pain, nausea and vomiting.  Genitourinary:  Negative for dysuria and hematuria.  Musculoskeletal:  Positive for back pain. Negative for myalgias.  Skin:  Negative for rash.  Neurological:  Positive for weakness. Negative for dizziness and headaches.   all other systems are negative except as noted in the HPI and PMH.    Physical Exam Updated Vital Signs BP (!) 181/113 (BP Location: Right Arm)   Pulse (!) 110   Temp 98.4 F (36.9 C) (Oral)   Resp 20   Ht 5' 7 (1.702 m)   Wt 83.9 kg   SpO2 99%   BMI 28.98 kg/m  Physical Exam Vitals and nursing note reviewed.  Constitutional:      General: He is not in acute distress.  Appearance: He is well-developed.  HENT:     Head: Normocephalic and atraumatic.     Mouth/Throat:     Pharynx: No oropharyngeal exudate.  Eyes:     Conjunctiva/sclera: Conjunctivae normal.     Pupils: Pupils are equal, round, and reactive to light.  Neck:     Comments: No meningismus. Cardiovascular:     Rate and Rhythm: Regular rhythm. Tachycardia present.     Heart sounds: Normal heart sounds. No murmur heard. Pulmonary:     Effort: Pulmonary effort is normal. No respiratory distress.     Breath sounds: Normal breath sounds.  Abdominal:     Palpations: Abdomen is soft.     Tenderness: There is no abdominal tenderness. There is no guarding or rebound.  Musculoskeletal:        General: Tenderness present. Normal  range of motion.     Cervical back: Normal range of motion and neck supple.     Comments: Right paraspinal lumbar tenderness, SI joint  5/5 strength in bilateral lower extremities. Ankle plantar and dorsiflexion intact. Great toe extension intact bilaterally. +2 DP and PT pulses.  +2 patellar reflexes bilaterally  Skin:    General: Skin is warm.  Neurological:     Mental Status: He is alert and oriented to person, place, and time.     Cranial Nerves: No cranial nerve deficit.     Motor: No abnormal muscle tone.     Coordination: Coordination normal.     Comments:  5/5 strength throughout. CN 2-12 intact.Equal grip strength.   Psychiatric:        Behavior: Behavior normal.     ED Results / Procedures / Treatments   Labs (all labs ordered are listed, but only abnormal results are displayed) Labs Reviewed  CBC WITH DIFFERENTIAL/PLATELET - Abnormal; Notable for the following components:      Result Value   WBC 12.8 (*)    Neutro Abs 9.3 (*)    Monocytes Absolute 1.1 (*)    All other components within normal limits  BASIC METABOLIC PANEL - Abnormal; Notable for the following components:   Glucose, Bld 117 (*)    All other components within normal limits    EKG None  Radiology No results found.  Procedures Procedures    Medications Ordered in ED Medications  HYDROmorphone  (DILAUDID ) injection 1 mg (has no administration in time range)  ketorolac  (TORADOL ) 30 MG/ML injection 30 mg (has no administration in time range)  diazepam  (VALIUM ) tablet 5 mg (has no administration in time range)  predniSONE  (DELTASONE ) tablet 60 mg (has no administration in time range)    ED Course/ Medical Decision Making/ A&P                                 Medical Decision Making Amount and/or Complexity of Data Reviewed Labs: ordered. Decision-making details documented in ED Course. Radiology: ordered and independent interpretation performed. Decision-making details documented in ED  Course. ECG/medicine tests: ordered and independent interpretation performed. Decision-making details documented in ED Course.  Risk Prescription drug management.   Chronic back pain.  No injury or fall.  Neurovascular intact.  Distal strength, sensation, pulses intact.  Low suspicion for cord compression or cauda equina.  Hypertensive on arrival.  Denies taking blood pressure medications and states it is due to pain. No chest pain or shortness of breath.  Low suspicion for hypertensive urgency or emergency.  2023 showed multilevel  disc protrusions both left and right.  Patient given pain medication as well as muscle relaxers.  He subsequently developed nausea and vomiting required Zofran .  Pain is severe to the point where he states he cannot walk and ambulate.  Previous MRI from 2023 reviewed.  That showed multilevel foraminal stenosis and disc bulging in the lumbar spine as well as a thoracic spine abnormality thought to be artifactual.  Given these findings we will repeat MRI today given his severe pain and inability to walk  Patient was comfortable prior to being transferred to MRI and then had severe pain again was unable to lie still or change out of his close.  MRI staff brought him back to the ED.  Will attempt additional pain medications including Dilaudid  and ketamine .  Does not have any red flags on exam.  Intact distal strength, sensation, pulses and reflexes.  However he does have tingling and numbness to his right leg.  Previous MRI in 2023 showed multilevel disc protrusions both left and right.  Will attempt MRI again after increased pain control. Care to be transferred at shift change to Dr. Freddi.        Final Clinical Impression(s) / ED Diagnoses Final diagnoses:  None    Rx / DC Orders ED Discharge Orders     None         Hermes Wafer, Garnette, MD 07/31/23 [phone]

## 2023-07-31 NOTE — ED Notes (Addendum)
 Per MRI staff, patient is unable to lay still to have MRI done due to pain, will attempt later when pain is under control

## 2023-07-31 NOTE — ED Notes (Signed)
 Pt reports feeling nauseous after medications; informed MD

## 2023-08-01 ENCOUNTER — Emergency Department (HOSPITAL_COMMUNITY)
Admission: EM | Admit: 2023-08-01 | Discharge: 2023-08-02 | Disposition: A | Discharge status: Home / Self Care | Payer: Self-pay | Attending: Emergency Medicine | Admitting: Emergency Medicine

## 2023-08-01 ENCOUNTER — Encounter (HOSPITAL_COMMUNITY): Payer: Self-pay | Admitting: Emergency Medicine

## 2023-08-01 DIAGNOSIS — M5441 Lumbago with sciatica, right side: Secondary | ICD-10-CM | POA: Diagnosis present

## 2023-08-01 DIAGNOSIS — M5431 Sciatica, right side: Secondary | ICD-10-CM

## 2023-08-01 NOTE — ED Triage Notes (Signed)
 Pt arriving via [facility] from home for sciatica flare up. Pt seen yesterday for same. Pt reports sitting down makes the pain worse.

## 2023-08-02 MED ORDER — DEXAMETHASONE SODIUM PHOSPHATE 10 MG/ML IJ SOLN
10.0000 mg | Freq: Once | INTRAMUSCULAR | Status: AC
  Administered 2023-08-02: 10 mg via INTRAMUSCULAR
  Filled 2023-08-02: qty 1

## 2023-08-02 MED ORDER — KETOROLAC TROMETHAMINE 60 MG/2ML IM SOLN
60.0000 mg | Freq: Once | INTRAMUSCULAR | Status: AC
  Administered 2023-08-02: 60 mg via INTRAMUSCULAR
  Filled 2023-08-02: qty 2

## 2023-08-02 MED ORDER — CYCLOBENZAPRINE HCL 10 MG PO TABS: 5.0000 mg | ORAL_TABLET | Freq: Two times a day (BID) | ORAL | 0 refills | Status: AC | PRN

## 2023-08-02 MED ORDER — GABAPENTIN 100 MG PO CAPS: 100.0000 mg | ORAL_CAPSULE | Freq: Three times a day (TID) | ORAL | 1 refills | Status: AC

## 2023-08-02 NOTE — ED Provider Notes (Signed)
 [place] EMERGENCY DEPARTMENT AT [facility] Provider Note   CSN: [id number] Arrival date & time: 08/01/23  2121     History  Chief Complaint  Patient presents with   Back Pain    Sciatica    Jose Valencia is a 43 y.o. male sciatic pain.   The history is provided by the patient and medical records.  Back Pain Location:  Lumbar spine Quality:  Aching, burning and shooting Radiates to:  R posterior upper leg and R foot Pain severity:  Severe Pain is:  Same all the time Duration:  4 days Timing:  Constant Progression:  Waxing and waning Chronicity:  Recurrent Relieved by:  Nothing Worsened by:  Ambulation, bending, sitting, twisting, standing and movement Ineffective treatments:  NSAIDs, narcotics, muscle relaxants, heating pad, being still, bed rest, ibuprofen  and lying down Associated symptoms: leg pain, numbness and paresthesias   Associated symptoms: no bladder incontinence, no bowel incontinence, no dysuria, no pelvic pain, no perianal numbness and no weakness        Home Medications Prior to Admission medications   Medication Sig Start Date End Date Taking? Authorizing Provider  albuterol  (VENTOLIN  HFA) 108 (90 Base) MCG/ACT inhaler Inhale 2 puffs into the lungs every 6 (six) hours as needed for wheezing or shortness of breath.    [provider]  ibuprofen  (ADVIL ) 600 MG tablet Take 1 tablet (600 mg total) by mouth every 6 (six) hours as needed. 01/24/22   Nivia Colon, PA-C  methylPREDNISolone  (MEDROL  DOSEPAK) 4 MG TBPK tablet As directed 07/31/23   Freddi Hamilton, MD  oxyCODONE -acetaminophen  (PERCOCET/ROXICET) 5-325 MG tablet Take 1 tablet by mouth every 4 (four) hours as needed for severe pain (pain score 7-10). 07/31/23   Freddi Hamilton, MD      Allergies    Shellfish-derived products and Other    Review of Systems   Review of Systems  Gastrointestinal:  Negative for bowel incontinence.  Genitourinary:  Negative for bladder incontinence,  dysuria and pelvic pain.  Musculoskeletal:  Positive for back pain.  Neurological:  Positive for numbness and paresthesias. Negative for weakness.    Physical Exam Updated Vital Signs BP (!) 145/91 (BP Location: Left Arm)   Pulse 89   Temp 98 F (36.7 C) (Oral)   Resp 15   SpO2 99%  Physical Exam  ED Results / Procedures / Treatments   Labs (all labs ordered are listed, but only abnormal results are displayed) Labs Reviewed - No data to display  EKG None  Radiology MR LUMBAR SPINE WO CONTRAST Result Date: 07/31/2023 CLINICAL DATA:  Back pain, cauda equina syndrome suspected, right-sided low back pain that radiates down right leg EXAM: MRI THORACIC AND LUMBAR SPINE WITHOUT CONTRAST TECHNIQUE: Multiplanar and multiecho pulse sequences of the thoracic and lumbar spine were obtained without intravenous contrast. COMPARISON:  No prior MRI of the thoracic spine available, correlation is made with 01/17/2022 MRI lumbar spine FINDINGS: MRI THORACIC SPINE FINDINGS Evaluation is somewhat limited by motion artifact. Alignment: No listhesis. Mild straightening of the normal thoracic kyphosis. S-shaped curvature of the thoracolumbar spine. Vertebrae: No acute fracture, evidence of discitis, or suspicious osseous lesion. Cord:  Normal signal and morphology. Paraspinal and other soft tissues: Negative. Disc levels: T7-T8: Mild disc bulge. Prominent epidural fat. Mild thecal sac narrowing. No neural foraminal narrowing. T8-T9: Mild disc bulge with left subarticular and foraminal protrusion. Mild facet arthropathy. Prominent epidural fat. Moderate thecal sac narrowing. Mild left neural foraminal narrowing. T9-T10: Moderate disc bulge. Mild  facet arthropathy. Prominent epidural fat. Moderate spinal canal stenosis. Mild left and moderate right neural foraminal narrowing. T10-T11: Mild disc bulge. Mild facet arthropathy. No spinal canal stenosis. Mild left neural foraminal narrowing. MRI LUMBAR SPINE FINDINGS  Evaluation is somewhat limited by motion artifact. Segmentation:  5 lumbar type vertebral bodies. Alignment: Straightening of the normal lumbar lordosis. Levocurvature. Redemonstrated trace retrolisthesis of L4 on L5 and L5 on S1. Vertebrae: No acute fracture, evidence of discitis, or suspicious osseous lesion. Endplate degenerative changes L4-L5, eccentric to the right, new compared to 2023. Conus medullaris and cauda equina: Conus extends to the L1 level. Evaluation of the conus and cauda equina is somewhat limited by motion. No definite signal abnormality is seen. Paraspinal and other soft tissues: Negative. Disc levels: T12-L1: No significant disc bulge. No spinal canal stenosis or neural foraminal narrowing. L1-L2: No significant disc bulge. No spinal canal stenosis or neural foraminal narrowing. L2-L3: No significant disc bulge. No spinal canal stenosis or neural foraminal narrowing. L3-L4: Disc desiccation and mild disc bulge with left foraminal and extreme lateral protrusion with annular fissure, which likely contacts the exiting left L3 nerve roots. Mild facet arthropathy. No spinal canal stenosis. Moderate left neural foraminal narrowing, likely unchanged. L4-L5: Disc desiccation and moderate disc bulge, with left foraminal protrusion with annular fissure, which likely contacts the exiting left L4 nerve roots. The right aspect of the disc bulge also likely contacts the exiting right L4 nerve roots. Mild facet arthropathy. Prominent epidural fat contributes to mild thecal sac narrowing, which appears to have advanced slightly from the prior exam. Moderate left and moderate to severe right neural foraminal narrowing also appears to progressed from the prior exam. L5-S1: Disc desiccation and moderate disc bulge, which has increased in size compared to the prior exam; the lateral aspects of the disc bulge likely contact the exiting left L5 nerve roots and may contact the right L5 nerve roots. Mild facet  arthropathy. 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 Mild left and mild-to-moderate right neural foraminal narrowing, which has progressed from the prior exam. IMPRESSION: THORACIC SPINE: 1. Evaluation is somewhat limited by motion artifact. Within this limitation, there is moderate spinal canal stenosis at T9-T10. Prominent epidural fat causes moderate thecal sac narrowing at T8-T9 and and mild thecal sac narrowing at T7-T8. 2. Mild left neural foraminal narrowing at T8-T9, T9-T10, and T10-T11. Moderate right neural foraminal narrowing at T9-T10. LUMBAR SPINE: 1. L4-L5 mild thecal sac narrowing, primarily due to epidural lipomatosis, which appears to have advanced slightly from the prior exam. Moderate left and moderate to severe right neural foraminal narrowing, which has progressed from the prior exam. The disc bulge and associated protrusions likely contact the exiting nerve roots. 2. L3-L4 moderate left neural foraminal narrowing, likely unchanged. A left extreme lateral protrusion with annular fissure likely contacts the exiting left L3 nerve roots. 3. L5-S1 mild left and mild-to-moderate right neural foraminal narrowing, which has progressed from the prior exam. The lateral aspects of the disc bulge likely contact the exiting left L5 nerve roots and may contact the right L5 nerve roots. Electronically Signed   By: Donald Campion M.D.   On: 07/31/2023 12:42   MR THORACIC SPINE WO CONTRAST Result Date: 07/31/2023 CLINICAL DATA:  Back pain, cauda equina syndrome suspected, right-sided low back pain that radiates down right leg EXAM: MRI THORACIC AND LUMBAR SPINE WITHOUT CONTRAST TECHNIQUE: Multiplanar and multiecho pulse sequences of the thoracic and lumbar spine were obtained without intravenous contrast. COMPARISON:  No  prior MRI of the thoracic spine available, correlation is made with 01/17/2022 MRI lumbar spine FINDINGS: MRI THORACIC SPINE FINDINGS Evaluation is somewhat limited by  motion artifact. Alignment: No listhesis. Mild straightening of the normal thoracic kyphosis. S-shaped curvature of the thoracolumbar spine. Vertebrae: No acute fracture, evidence of discitis, or suspicious osseous lesion. Cord:  Normal signal and morphology. Paraspinal and other soft tissues: Negative. Disc levels: T7-T8: Mild disc bulge. Prominent epidural fat. Mild thecal sac narrowing. No neural foraminal narrowing. T8-T9: Mild disc bulge with left subarticular and foraminal protrusion. Mild facet arthropathy. Prominent epidural fat. Moderate thecal sac narrowing. Mild left neural foraminal narrowing. T9-T10: Moderate disc bulge. Mild facet arthropathy. Prominent epidural fat. Moderate spinal canal stenosis. Mild left and moderate right neural foraminal narrowing. T10-T11: Mild disc bulge. Mild facet arthropathy. No spinal canal stenosis. Mild left neural foraminal narrowing. MRI LUMBAR SPINE FINDINGS Evaluation is somewhat limited by motion artifact. Segmentation:  5 lumbar type vertebral bodies. Alignment: Straightening of the normal lumbar lordosis. Levocurvature. Redemonstrated trace retrolisthesis of L4 on L5 and L5 on S1. Vertebrae: No acute fracture, evidence of discitis, or suspicious osseous lesion. Endplate degenerative changes L4-L5, eccentric to the right, new compared to 2023. Conus medullaris and cauda equina: Conus extends to the L1 level. Evaluation of the conus and cauda equina is somewhat limited by motion. No definite signal abnormality is seen. Paraspinal and other soft tissues: Negative. Disc levels: T12-L1: No significant disc bulge. No spinal canal stenosis or neural foraminal narrowing. L1-L2: No significant disc bulge. No spinal canal stenosis or neural foraminal narrowing. L2-L3: No significant disc bulge. No spinal canal stenosis or neural foraminal narrowing. L3-L4: Disc desiccation and mild disc bulge with left foraminal and extreme lateral protrusion with annular fissure, which  likely contacts the exiting left L3 nerve roots. Mild facet arthropathy. No spinal canal stenosis. Moderate left neural foraminal narrowing, likely unchanged. L4-L5: Disc desiccation and moderate disc bulge, with left foraminal protrusion with annular fissure, which likely contacts the exiting left L4 nerve roots. The right aspect of the disc bulge also likely contacts the exiting right L4 nerve roots. Mild facet arthropathy. Prominent epidural fat contributes to mild thecal sac narrowing, which appears to have advanced slightly from the prior exam. Moderate left and moderate to severe right neural foraminal narrowing also appears to progressed from the prior exam. L5-S1: Disc desiccation and moderate disc bulge, which has increased in size compared to the prior exam; the lateral aspects of the disc bulge likely contact the exiting left L5 nerve roots and may contact the right L5 nerve roots. Mild facet arthropathy. 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 Mild left and mild-to-moderate right neural foraminal narrowing, which has progressed from the prior exam. IMPRESSION: THORACIC SPINE: 1. Evaluation is somewhat limited by motion artifact. Within this limitation, there is moderate spinal canal stenosis at T9-T10. Prominent epidural fat causes moderate thecal sac narrowing at T8-T9 and and mild thecal sac narrowing at T7-T8. 2. Mild left neural foraminal narrowing at T8-T9, T9-T10, and T10-T11. Moderate right neural foraminal narrowing at T9-T10. LUMBAR SPINE: 1. L4-L5 mild thecal sac narrowing, primarily due to epidural lipomatosis, which appears to have advanced slightly from the prior exam. Moderate left and moderate to severe right neural foraminal narrowing, which has progressed from the prior exam. The disc bulge and associated protrusions likely contact the exiting nerve roots. 2. L3-L4 moderate left neural foraminal narrowing, likely unchanged. A left extreme lateral protrusion with  annular fissure likely contacts the exiting  left L3 nerve roots. 3. L5-S1 mild left and mild-to-moderate right neural foraminal narrowing, which has progressed from the prior exam. The lateral aspects of the disc bulge likely contact the exiting left L5 nerve roots and may contact the right L5 nerve roots. Electronically Signed   By: Donald Campion M.D.   On: 07/31/2023 12:42    Procedures Procedures    Medications Ordered in ED Medications - No data to display  ED Course/ Medical Decision Making/ A&P                                 Medical Decision Making Patient here with Sciatica. I reviewed the MRI with Dr. Gillie at bedside. NO surgical emergencies noted including no cord compression.  Pt is on oxycodone , steroids, and muscle relaxers. Will add nsaids and gabapentin . He is encouraged to make an appointment witn neurosurgery  No red flag fidings  Risk Prescription drug management.           Final Clinical Impression(s) / ED Diagnoses Final diagnoses:  None    Rx / DC Orders ED Discharge Orders     None         Arloa Chroman, PA-C 08/05/23 1521    Raford Lenis, MD 08/06/23 [phone]

## 2023-08-02 NOTE — Discharge Instructions (Addendum)
Contact a health care provider if: Your pain is not controlled by medicine. Your pain does not improve or gets worse. Your pain lasts longer than 4 weeks. You have unexplained weight loss. Get help right away if: You are not able to control when you urinate or have bowel movements (incontinence). You have: Weakness in your lower back, pelvis, buttocks, or legs that gets worse. Redness or swelling of your back. A burning sensation when you urinate.

## 2024-06-30 ENCOUNTER — Ambulatory Visit (HOSPITAL_COMMUNITY): Payer: Self-pay

## 2024-06-30 DIAGNOSIS — S3992XA Unspecified injury of lower back, initial encounter: Secondary | ICD-10-CM

## 2024-06-30 NOTE — Progress Notes (Signed)
" °  Because of injury and need for potential leave of absence which we cannot provide via an e-visit, I feel your condition warrants further evaluation and I recommend that you be seen in a face-to-face visit.   NOTE: There will be NO CHARGE for this E-Visit   If you are having a true medical emergency, please call 911.     For an urgent face to face visit, [facility] has multiple urgent care centers for your convenience.  Click the link below for the full list of locations and hours, walk-in wait times, appointment scheduling options and driving directions:  Urgent Care - [place], [place], [place], [place], [place], [place]  [place]     Your MyChart E-visit questionnaire answers were reviewed by a board certified advanced clinical practitioner to complete your personal care plan based on your specific symptoms.    Thank you for using e-Visits.    "
